# Patient Record
Sex: Male | Born: 1990 | Race: White | Hispanic: No | Marital: Single | Smoking: Never smoker
Health system: Southern US, Community
[De-identification: ages and names within clinical notes are randomized; demographics above are authoritative.]

## PROBLEM LIST (undated history)

## (undated) DIAGNOSIS — F329 Major depressive disorder, single episode, unspecified: Secondary | ICD-10-CM

## (undated) DIAGNOSIS — F419 Anxiety disorder, unspecified: Secondary | ICD-10-CM

## (undated) DIAGNOSIS — F32A Depression, unspecified: Secondary | ICD-10-CM

## (undated) HISTORY — DX: Depression, unspecified: F32.A

## (undated) HISTORY — DX: Major depressive disorder, single episode, unspecified: F32.9

## (undated) HISTORY — DX: Anxiety disorder, unspecified: F41.9

---

## 2013-09-25 ENCOUNTER — Emergency Department (INDEPENDENT_AMBULATORY_CARE_PROVIDER_SITE_OTHER): Payer: BC Managed Care – PPO

## 2013-09-25 ENCOUNTER — Encounter (HOSPITAL_COMMUNITY): Payer: Self-pay | Admitting: Emergency Medicine

## 2013-09-25 ENCOUNTER — Emergency Department (INDEPENDENT_AMBULATORY_CARE_PROVIDER_SITE_OTHER)
Admission: EM | Admit: 2013-09-25 | Discharge: 2013-09-25 | Disposition: A | Discharge status: Home / Self Care | Payer: BC Managed Care – PPO | Source: Home / Self Care | Attending: Family Medicine | Admitting: Family Medicine

## 2013-09-25 DIAGNOSIS — J40 Bronchitis, not specified as acute or chronic: Secondary | ICD-10-CM

## 2013-09-25 MED ORDER — IPRATROPIUM BROMIDE 0.02 % IN SOLN
0.5000 mg | Freq: Once | RESPIRATORY_TRACT | Status: AC
  Administered 2013-09-25: 0.5 mg via RESPIRATORY_TRACT

## 2013-09-25 MED ORDER — ALBUTEROL SULFATE (5 MG/ML) 0.5% IN NEBU
5.0000 mg | INHALATION_SOLUTION | Freq: Once | RESPIRATORY_TRACT | Status: AC
  Administered 2013-09-25: 5 mg via RESPIRATORY_TRACT

## 2013-09-25 MED ORDER — ALBUTEROL SULFATE HFA 108 (90 BASE) MCG/ACT IN AERS
INHALATION_SPRAY | RESPIRATORY_TRACT | Status: AC
  Filled 2013-09-25: qty 6.7

## 2013-09-25 MED ORDER — IPRATROPIUM BROMIDE 0.02 % IN SOLN
RESPIRATORY_TRACT | Status: AC
  Filled 2013-09-25: qty 2.5

## 2013-09-25 MED ORDER — SODIUM CHLORIDE 0.9 % IN NEBU
INHALATION_SOLUTION | RESPIRATORY_TRACT | Status: AC
  Filled 2013-09-25: qty 6

## 2013-09-25 MED ORDER — PREDNISONE 50 MG PO TABS: 50.0000 mg | ORAL_TABLET | Freq: Every day | ORAL | Status: DC

## 2013-09-25 MED ORDER — ALBUTEROL SULFATE HFA 108 (90 BASE) MCG/ACT IN AERS: 1.0000 | INHALATION_SPRAY | Freq: Four times a day (QID) | RESPIRATORY_TRACT | Status: DC | PRN

## 2013-09-25 MED ORDER — ALBUTEROL SULFATE (5 MG/ML) 0.5% IN NEBU
INHALATION_SOLUTION | RESPIRATORY_TRACT | Status: AC
  Filled 2013-09-25: qty 1

## 2013-09-25 MED ORDER — ALBUTEROL SULFATE HFA 108 (90 BASE) MCG/ACT IN AERS
1.0000 | INHALATION_SPRAY | Freq: Four times a day (QID) | RESPIRATORY_TRACT | Status: DC | PRN
  Administered 2013-09-25: 2 via RESPIRATORY_TRACT

## 2013-09-25 NOTE — ED Notes (Signed)
Pt c/o constant chest pain onset yest around 1500 Sxs include: pain that increases w/deep breaths, palpitations, left arm numbness yest (not today), blurry vision, HA, SOB, nauseas Denies: diaphoretic.  Alert w/no signs of acute distress.

## 2013-09-25 NOTE — ED Provider Notes (Signed)
Phillip Valentine is a 22 y.o. male who presents to Urgent Care today for patient notes left-sided nonradiating nonexertional chest pain. This started yesterday. He describes a stabbing sensation. He denies any shortness of breath nausea vomiting or diarrhea or palpitations. He does not that he seemed to have some worsening shortness of breath with exertion over the last several weeks. He denies any leg swelling or recent immobility. He feels well otherwise.   History reviewed. No pertinent past medical history. History  Substance Use Topics  . Smoking status: Never Smoker   . Smokeless tobacco: Not on file  . Alcohol Use: Yes   ROS as above Medications reviewed. Current Facility-Administered Medications  Medication Dose Route Frequency Provider Last Rate Last Dose  . albuterol (PROVENTIL HFA;VENTOLIN HFA) 108 (90 BASE) MCG/ACT inhaler 1-2 puff  1-2 puff Inhalation Q6H PRN Rodolph Bong, MD       Current Outpatient Prescriptions  Medication Sig Dispense Refill  . albuterol (PROVENTIL HFA;VENTOLIN HFA) 108 (90 BASE) MCG/ACT inhaler Inhale 1-2 puffs into the lungs every 6 (six) hours as needed for wheezing or shortness of breath.  1 Inhaler  1  . predniSONE (DELTASONE) 50 MG tablet Take 1 tablet (50 mg total) by mouth daily.  5 tablet  0    Exam:  BP 136/60  Pulse 68  Temp(Src) 98.2 F (36.8 C) (Oral)  Resp 18  SpO2 100% Gen: Well NAD HEENT: EOMI,  MMM Lungs: Normal work of breathing. Rhonchi left lower lobe Heart: RRR no MRG Abd: NABS, NT, ND Exts: Non edematous BL  LE, warm and well perfused.   Twelve-lead EKG shows normal sinus rhythm at 69 beats per minute. Mild sinus arrhythmia. Otherwise unremarkable  Patient was given DuoNeb nebulizer treatment and had considerable improvement in symptoms and normalization of his pulmonary exam.  No results found for this or any previous visit (from the past 24 hour(s)). Dg Chest 2 View  09/25/2013   CLINICAL DATA:  Left-sided chest  pain  EXAM: CHEST  2 VIEW  COMPARISON:  None.  FINDINGS: The heart size and mediastinal contours are within normal limits. Both lungs are clear. The visualized skeletal structures are unremarkable.  IMPRESSION: No active cardiopulmonary disease.   Electronically Signed   By: Alcide Clever M.D.   On: 09/25/2013 13:50    Assessment and Plan: 22 y.o. male with bronchitis versus reactive airway disease.  Plan to treat with prednisone, and albuterol.  [facility] or back here as needed.  Discussed warning signs or symptoms. Please see discharge instructions. Patient expresses understanding.      Rodolph Bong, MD 09/25/13 [phone]

## 2014-07-17 ENCOUNTER — Ambulatory Visit (INDEPENDENT_AMBULATORY_CARE_PROVIDER_SITE_OTHER): Payer: Self-pay

## 2014-07-17 ENCOUNTER — Ambulatory Visit (INDEPENDENT_AMBULATORY_CARE_PROVIDER_SITE_OTHER): Payer: Self-pay | Admitting: Internal Medicine

## 2014-07-17 VITALS — BP 130/72 | HR 80 | Temp 98.6°F | Resp 12 | Ht 72.0 in | Wt 175.4 lb

## 2014-07-17 DIAGNOSIS — S61209A Unspecified open wound of unspecified finger without damage to nail, initial encounter: Secondary | ICD-10-CM

## 2014-07-17 DIAGNOSIS — S060X9S Concussion with loss of consciousness of unspecified duration, sequela: Secondary | ICD-10-CM

## 2014-07-17 DIAGNOSIS — S0120XA Unspecified open wound of nose, initial encounter: Secondary | ICD-10-CM

## 2014-07-17 DIAGNOSIS — IMO0002 Reserved for concepts with insufficient information to code with codable children: Secondary | ICD-10-CM

## 2014-07-17 DIAGNOSIS — S069X9S Unspecified intracranial injury with loss of consciousness of unspecified duration, sequela: Secondary | ICD-10-CM

## 2014-07-17 DIAGNOSIS — S0083XS Contusion of other part of head, sequela: Secondary | ICD-10-CM

## 2014-07-17 DIAGNOSIS — S069XAS Unspecified intracranial injury with loss of consciousness status unknown, sequela: Secondary | ICD-10-CM

## 2014-07-17 DIAGNOSIS — S022XXA Fracture of nasal bones, initial encounter for closed fracture: Secondary | ICD-10-CM

## 2014-07-17 MED ORDER — DOXYCYCLINE HYCLATE 100 MG PO TABS: 100.0000 mg | ORAL_TABLET | Freq: Two times a day (BID) | ORAL | Status: DC

## 2014-07-17 MED ORDER — MUPIROCIN 2 % EX OINT: 1.0000 "application " | TOPICAL_OINTMENT | Freq: Three times a day (TID) | CUTANEOUS | Status: DC

## 2014-07-17 NOTE — Patient Instructions (Addendum)
Wash with soap and water.    WOUND CARE Please return in Thursday 9/3 days to have your stitches/staples removed or sooner if you have concerns. Marland Kitchen Keep area clean and dry for 24 hours. Do not remove bandage, if applied. . After 24 hours, remove bandage and wash wound gently with mild soap and warm water. Reapply a new bandage after cleaning wound, if directed. . Continue daily cleansing with soap and water until stitches/staples are removed. . Do not apply any ointments or creams to the wound while stitches/staples are in place, as this may cause delayed healing. . Notify the office if you experience any of the following signs of infection: Swelling, redness, pus drainage, streaking, fever >101.0 F . Notify the office if you experience excessive bleeding that does not stop after 15-20 minutes of constant, firm pressure.     How Much is Too Much Alcohol? Drinking too much alcohol can cause injury, accidents, and health problems. These types of problems can include:   Car crashes.  Falls.  Family fighting (domestic violence).  Drowning.  Fights.  Injuries.  Burns.  Damage to certain organs.  Having a baby with birth defects. ONE DRINK CAN BE TOO MUCH WHEN YOU ARE:  Working.  Pregnant or breastfeeding.  Taking medicines. Ask your doctor.  Driving or planning to drive. WHAT IS A STANDARD DRINK?   1 regular beer (12 ounces or 360 milliliters).  1 glass of wine (5 ounces or 150 milliliters).  1 shot of liquor (1.5 ounces or 45 milliliters). BLOOD ALCOHOL LEVELS   .00 A person is sober.  Marland Kitchen03 A person has no trouble keeping balance, talking, or seeing right, but a "buzz" may be felt.  Marland Kitchen05 A person feels "buzzed" and relaxed.  Marland Kitchen08 or .10  A person is drunk. He or she has trouble talking, seeing right, and keeping his or her balance.  .15 A person loses body control and may pass out (blackout).  .20 A person has trouble walking (staggering) and throws up  (vomits).  .30 A person will pass out (unconscious).  .40+ A person will be in a coma. Death is possible. If you or someone you know has a drinking problem, get help from a doctor.  Document Released: 09/01/2009 Document Revised: 01/28/2012 Document Reviewed: 09/01/2009 [facility] Patient Information 2015 [place], [place]. This information is not intended to replace advice given to you by your health care provider. Make sure you discuss any questions you have with your health care provider. Alcohol Intoxication Alcohol intoxication occurs when the amount of alcohol that a person has consumed impairs his or her ability to mentally and physically function. Alcohol directly impairs the normal chemical activity of the brain. Drinking large amounts of alcohol can lead to changes in mental function and behavior, and it can cause many physical effects that can be harmful.  Alcohol intoxication can range in severity from mild to very severe. Various factors can affect the level of intoxication that occurs, such as the person's age, gender, weight, frequency of alcohol consumption, and the presence of other medical conditions (such as diabetes, seizures, or heart conditions). Dangerous levels of alcohol intoxication may occur when people drink large amounts of alcohol in a short period (binge drinking). Alcohol can also be especially dangerous when combined with certain prescription medicines or "recreational" drugs. SIGNS AND SYMPTOMS Some common signs and symptoms of mild alcohol intoxication include:  Loss of coordination.  Changes in mood and behavior.  Impaired judgment.  Slurred speech. As  alcohol intoxication progresses to more severe levels, other signs and symptoms will appear. These may include:  Vomiting.  Confusion and impaired memory.  Slowed breathing.  Seizures.  Loss of consciousness. DIAGNOSIS  Your health care provider will take a medical history and perform a physical exam.  You will be asked about the amount and type of alcohol you have consumed. Blood tests will be done to measure the concentration of alcohol in your blood. In many places, your blood alcohol level must be lower than 80 mg/dL (1.61%) to legally drive. However, many dangerous effects of alcohol can occur at much lower levels.  TREATMENT  People with alcohol intoxication often do not require treatment. Most of the effects of alcohol intoxication are temporary, and they go away as the alcohol naturally leaves the body. Your health care provider will monitor your condition until you are stable enough to go home. Fluids are sometimes given through an IV access tube to help prevent dehydration.  HOME CARE INSTRUCTIONS  Do not drive after drinking alcohol.  Stay hydrated. Drink enough water and fluids to keep your urine clear or pale yellow. Avoid caffeine.   Only take over-the-counter or prescription medicines as directed by your health care provider.  SEEK MEDICAL CARE IF:   You have persistent vomiting.   You do not feel better after a few days.  You have frequent alcohol intoxication. Your health care provider can help determine if you should see a substance use treatment counselor. SEEK IMMEDIATE MEDICAL CARE IF:   You become shaky or tremble when you try to stop drinking.   You shake uncontrollably (seizure).   You throw up (vomit) blood. This may be bright red or may look like black coffee grounds.   You have blood in your stool. This may be bright red or may appear as a black, tarry, bad smelling stool.   You become lightheaded or faint.  MAKE SURE YOU:   Understand these instructions.  Will watch your condition.  Will get help right away if you are not doing well or get worse. Document Released: 08/15/2005 Document Revised: 07/08/2013 Document Reviewed: 04/10/2013 [facility] Patient Information 2015 [place], [place]. This information is not intended to replace advice given to  you by your health care provider. Make sure you discuss any questions you have with your health care provider. Head Injury You have received a head injury. It does not appear serious at this time. Headaches and vomiting are common following head injury. It should be easy to awaken from sleeping. Sometimes it is necessary for you to stay in the emergency department for a while for observation. Sometimes admission to the hospital may be needed. After injuries such as yours, most problems occur within the first 24 hours, but side effects may occur up to 7-10 days after the injury. It is important for you to carefully monitor your condition and contact your health care provider or seek immediate medical care if there is a change in your condition. WHAT ARE THE TYPES OF HEAD INJURIES? Head injuries can be as minor as a bump. Some head injuries can be more severe. More severe head injuries include:  A jarring injury to the brain (concussion).  A bruise of the brain (contusion). This mean there is bleeding in the brain that can cause swelling.  A cracked skull (skull fracture).  Bleeding in the brain that collects, clots, and forms a bump (hematoma). WHAT CAUSES A HEAD INJURY? A serious head injury is most likely to  happen to someone who is in a car wreck and is not wearing a seat belt. Other causes of major head injuries include bicycle or motorcycle accidents, sports injuries, and falls. HOW ARE HEAD INJURIES DIAGNOSED? A complete history of the event leading to the injury and your current symptoms will be helpful in diagnosing head injuries. Many times, pictures of the brain, such as CT or MRI are needed to see the extent of the injury. Often, an overnight hospital stay is necessary for observation.  WHEN SHOULD I SEEK IMMEDIATE MEDICAL CARE?  You should get help right away if:  You have confusion or drowsiness.  You feel sick to your stomach (nauseous) or have continued, forceful vomiting.  You  have dizziness or unsteadiness that is getting worse.  You have severe, continued headaches not relieved by medicine. Only take over-the-counter or prescription medicines for pain, fever, or discomfort as directed by your health care provider.  You do not have normal function of the arms or legs or are unable to walk.  You notice changes in the black spots in the center of the colored part of your eye (pupil).  You have a clear or bloody fluid coming from your nose or ears.  You have a loss of vision. During the next 24 hours after the injury, you must stay with someone who can watch you for the warning signs. This person should contact local emergency services (911 in the [place] if you have seizures, you become unconscious, or you are unable to wake up. HOW CAN I PREVENT A HEAD INJURY IN THE FUTURE? The most important factor for preventing major head injuries is avoiding motor vehicle accidents. To minimize the potential for damage to your head, it is crucial to wear seat belts while riding in motor vehicles. Wearing helmets while bike riding and playing collision sports (like football) is also helpful. Also, avoiding dangerous activities around the house will further help reduce your risk of head injury.  WHEN CAN I RETURN TO NORMAL ACTIVITIES AND ATHLETICS? You should be reevaluated by your health care provider before returning to these activities. If you have any of the following symptoms, you should not return to activities or contact sports until 1 week after the symptoms have stopped:  Persistent headache.  Dizziness or vertigo.  Poor attention and concentration.  Confusion.  Memory problems.  Nausea or vomiting.  Fatigue or tire easily.  Irritability.  Intolerant of bright lights or loud noises.  Anxiety or depression.  Disturbed sleep. MAKE SURE YOU:   Understand these instructions.  Will watch your condition.  Will get help right away if you are not doing well or  get worse. Document Released: 11/05/2005 Document Revised: 11/10/2013 Document Reviewed: 07/13/2013 [facility] Patient Information 2015 [place], [place]. This information is not intended to replace advice given to you by your health care provider. Make sure you discuss any questions you have with your health care provider.
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
## 2014-07-18 ENCOUNTER — Ambulatory Visit (INDEPENDENT_AMBULATORY_CARE_PROVIDER_SITE_OTHER): Payer: Self-pay | Admitting: Internal Medicine

## 2014-07-18 VITALS — BP 118/72 | HR 65 | Temp 98.6°F | Resp 15 | Ht 71.5 in | Wt 176.8 lb

## 2014-07-18 DIAGNOSIS — S61209A Unspecified open wound of unspecified finger without damage to nail, initial encounter: Secondary | ICD-10-CM

## 2014-07-18 DIAGNOSIS — S0180XA Unspecified open wound of other part of head, initial encounter: Secondary | ICD-10-CM

## 2014-07-18 DIAGNOSIS — S61209D Unspecified open wound of unspecified finger without damage to nail, subsequent encounter: Secondary | ICD-10-CM

## 2014-07-18 DIAGNOSIS — S0180XD Unspecified open wound of other part of head, subsequent encounter: Secondary | ICD-10-CM

## 2014-07-18 DIAGNOSIS — S022XXD Fracture of nasal bones, subsequent encounter for fracture with routine healing: Secondary | ICD-10-CM

## 2014-07-18 DIAGNOSIS — IMO0001 Reserved for inherently not codable concepts without codable children: Secondary | ICD-10-CM

## 2014-07-18 DIAGNOSIS — Z5189 Encounter for other specified aftercare: Secondary | ICD-10-CM

## 2014-07-18 NOTE — Progress Notes (Signed)
   Subjective:    Patient ID: Phillip Valentine, male    DOB: [birth year], 23 y.o.   MRN: [id number]  HPI Fx nose minimal displaced. Wound nose healing Avulsed finger nail replaced and healing. Multiple abrasions all over using mupirocin No signs of concussion and no HA. All joints full rom   Review of Systems     Objective:   Physical Exam  Constitutional: He is oriented to person, place, and time. He appears well-developed and well-nourished. No distress.  HENT:  Head: Normocephalic.  Right Ear: External ear normal.  Left Ear: External ear normal.  Eyes: EOM are normal. Pupils are equal, round, and reactive to light. No scleral icterus.  Neck: Normal range of motion. Neck supple.  Pulmonary/Chest: Effort normal.  Musculoskeletal: He exhibits edema and tenderness.  Lymphadenopathy:    He has no cervical adenopathy.  Neurological: He is alert and oriented to person, place, and time. No cranial nerve deficit. He exhibits normal muscle tone. Coordination normal.  Skin: Abrasion, bruising, ecchymosis and laceration noted. Rash is not pustular.     Psychiatric: He has a normal mood and affect. His behavior is normal. Judgment and thought content normal.          Assessment & Plan:  CD of fx ENT phone given Remove sutures 4 days Wound and head care

## 2014-07-18 NOTE — Patient Instructions (Signed)
Nasal Fracture A nasal fracture is a break or crack in the bones of the nose. A minor break usually heals in a month. You often will receive black eyes from a nasal fracture. This is not a cause for concern. The black eyes will go away over 1 to 2 weeks.  DIAGNOSIS  Your caregiver may want to examine you if you are concerned about a fracture of the nose. X-rays of the nose may not show a nasal fracture even when one is present. Sometimes your caregiver must wait 1 to 5 days after the injury to re-check the nose for alignment and to take additional X-rays. Sometimes the caregiver must wait until the swelling has gone down. TREATMENT Minor fractures that have caused no deformity often do not require treatment. More serious fractures where bones are displaced may require surgery. This will take place after the swelling is gone. Surgery will stabilize and align the fracture. HOME CARE INSTRUCTIONS   Put ice on the injured area.  Put ice in a plastic bag.  Place a towel between your skin and the bag.  Leave the ice on for 15-20 minutes, 03-04 times a day.  Take medications as directed by your caregiver.  Only take over-the-counter or prescription medicines for pain, discomfort, or fever as directed by your caregiver.  If your nose starts bleeding, squeeze the soft parts of the nose against the center wall while you are sitting in an upright position for 10 minutes.  Contact sports should be avoided for at least 3 to 4 weeks or as directed by your caregiver. SEEK MEDICAL CARE IF:  Your pain increases or becomes severe.  You continue to have nosebleeds.  The shape of your nose does not return to normal within 5 days.  You have pus draining from the nose. SEEK IMMEDIATE MEDICAL CARE IF:   You have bleeding from your nose that does not stop after 20 minutes of pinching the nostrils closed and keeping ice on the nose.  You have clear fluid draining from your nose.  You notice a grape-like  swelling on the dividing wall between the nostrils (septum). This is a collection of blood (hematoma) that must be drained to help prevent infection.  You have difficulty moving your eyes.  You have recurrent vomiting. Document Released: 11/02/2000 Document Revised: 01/28/2012 Document Reviewed: 02/19/2011 [employer] Patient Information 2015 [employer]. This information is not intended to replace advice given to you by your health care provider. Make sure you discuss any questions you have with your health care provider.  

## 2014-07-22 ENCOUNTER — Ambulatory Visit (INDEPENDENT_AMBULATORY_CARE_PROVIDER_SITE_OTHER): Payer: Self-pay | Admitting: Family Medicine

## 2014-07-22 VITALS — BP 130/70 | HR 61 | Temp 97.7°F | Resp 16 | Ht 71.75 in | Wt 176.0 lb

## 2014-07-22 DIAGNOSIS — Z5189 Encounter for other specified aftercare: Secondary | ICD-10-CM
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
## 2015-05-05 ENCOUNTER — Ambulatory Visit (INDEPENDENT_AMBULATORY_CARE_PROVIDER_SITE_OTHER): Payer: Self-pay | Admitting: Emergency Medicine

## 2015-05-05 VITALS — BP 142/82 | HR 75 | Temp 98.6°F | Resp 16 | Ht 71.5 in | Wt 188.0 lb

## 2015-05-05 DIAGNOSIS — F32A Depression, unspecified: Secondary | ICD-10-CM | POA: Insufficient documentation

## 2015-05-05 DIAGNOSIS — F329 Major depressive disorder, single episode, unspecified: Secondary | ICD-10-CM | POA: Diagnosis not present

## 2015-05-05 MED ORDER — SERTRALINE HCL 50 MG PO TABS: 50.0000 mg | ORAL_TABLET | Freq: Every day | ORAL | Status: AC

## 2015-05-05 MED ORDER — LORAZEPAM 1 MG PO TABS: 1.0000 mg | ORAL_TABLET | Freq: Three times a day (TID) | ORAL | Status: AC | PRN

## 2015-05-05 NOTE — Patient Instructions (Signed)

## 2015-05-05 NOTE — Progress Notes (Signed)
Subjective:  Patient ID: Phillip Valentine, male    DOB: 17-May-1991  Age: 24 y.o. MRN: [id number]  CC: Exposure to STD; Medication Refill; Depression; and Anxiety   HPI Phillip Valentine presents  with a history of anxiety and depression. Been treated by psychologist who recommended he come and obtain a prescription for some psychoactive medication. He is on [employer] and is imminently going to transfer from [facility] He has some anxiety about that. 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  He's been so pressured that  he threw a glass and against a wall. He saw the glass fragments and picked a piece up and scratched his arm several times to relieve the pressure. He denies any suicidal thoughts or thoughts of harm to other people attempted suicidal act. He is eating well but not sleeping well.  He said that his current sexual partner told him that she has HPV and he wants an STD check he said that he's been sexually active for a long, not ever be tested. He's asymptomatic.Marland Kitchen History Phillip Valentine has a past medical history of Anxiety and Depression.   He has no past surgical history on file.   His  family history includes Alcohol abuse in his father; Depression in his mother; Diabetes in his maternal uncle; Heart disease in his maternal grandfather; Hyperlipidemia in his maternal grandmother.  He   reports that he has never smoked. He has never used smokeless tobacco. He reports that he drinks alcohol. He reports that he does not use illicit drugs.  Outpatient Prescriptions Prior to Visit  Medication Sig Dispense Refill  . doxycycline (VIBRA-TABS) 100 MG tablet Take 1 tablet (100 mg total) by mouth 2 (two) times daily. 20 tablet 0  . mupirocin ointment (BACTROBAN) 2 % Apply 1 application topically 3 (three) times daily. 60 g 2   No facility-administered medications prior to visit.    History   Social History  . Marital Status:  Single    Spouse Name: N/A  . Number of Children: N/A  . Years of Education: N/A   Social History Main Topics  . Smoking status: Never Smoker   . Smokeless tobacco: Never Used  . Alcohol Use: Yes  . Drug Use: No  . Sexual Activity: Not on file   Other Topics Concern  . None   Social History Narrative     Review of Systems  Objective:  BP 142/82 mmHg  Pulse 75  Temp(Src) 98.6 F (37 C) (Oral)  Resp 16  Ht 5' 11.5" (1.816 m)  Wt 188 lb (85.276 kg)  BMI 25.86 kg/m2  SpO2 99%  Physical Exam    Assessment & Plan:   Phillip Valentine was seen today for exposure to std, medication refill, depression and anxiety.  Diagnoses and all orders for this visit:  Depression Orders: -     GC/Chlamydia Probe Amp -     RPR -     HIV antibody -     HSV(herpes simplex vrs) 1+2 ab-IgG  Other orders -     sertraline (ZOLOFT) 50 MG tablet; Take 1 tablet (50 mg total) by mouth daily. -     LORazepam (ATIVAN) 1 MG tablet; Take 1 tablet (1 mg total) by mouth every 8 (eight) hours as needed for anxiety.   I have discontinued Mr. Benak mupirocin ointment and doxycycline. I am also having him start on sertraline and LORazepam.  Meds ordered this encounter  Medications  .  sertraline (ZOLOFT) 50 MG tablet    Sig: Take 1 tablet (50 mg total) by mouth daily.    Dispense:  30 tablet    Refill:  3  . LORazepam (ATIVAN) 1 MG tablet    Sig: Take 1 tablet (1 mg total) by mouth every 8 (eight) hours as needed for anxiety.    Dispense:  60 tablet    Refill:  0   With a lengthy discussion about suicide and suicide risk I don't believe that he is a suicide risk is put on Paxil and Ativan for the short-term and a month or so, stylet medicines working for him  Appropriate red flag conditions were discussed with the patient as well as actions that should be taken.  Patient expressed his understanding.  Follow-up: Return if symptoms worsen or fail to improve.  Carmelina Dane, MD

## 2015-05-06 LAB — RPR

## 2015-05-06 LAB — GC/CHLAMYDIA PROBE AMP
CT PROBE, AMP APTIMA: NEGATIVE
GC Probe RNA: NEGATIVE

## 2015-05-07 LAB — HIV ANTIBODY (ROUTINE TESTING W REFLEX): HIV 1&2 Ab, 4th Generation: NONREACTIVE

## 2015-05-09 LAB — HSV(HERPES SIMPLEX VRS) I + II AB-IGG: HSV 1 Glycoprotein G Ab, IgG: <0.1 IV

## 2016-03-08 ENCOUNTER — Ambulatory Visit: Payer: Self-pay | Admitting: Anesthesiology

## 2016-06-19 IMAGING — CR DG FINGER LITTLE 2+V*R*
1 series · 1 of 1 positions shown · non-contrast
Comparison: None.

CLINICAL DATA: Fifth finger pain and abrasion

EXAM:
RIGHT LITTLE FINGER 2+V

[AP]
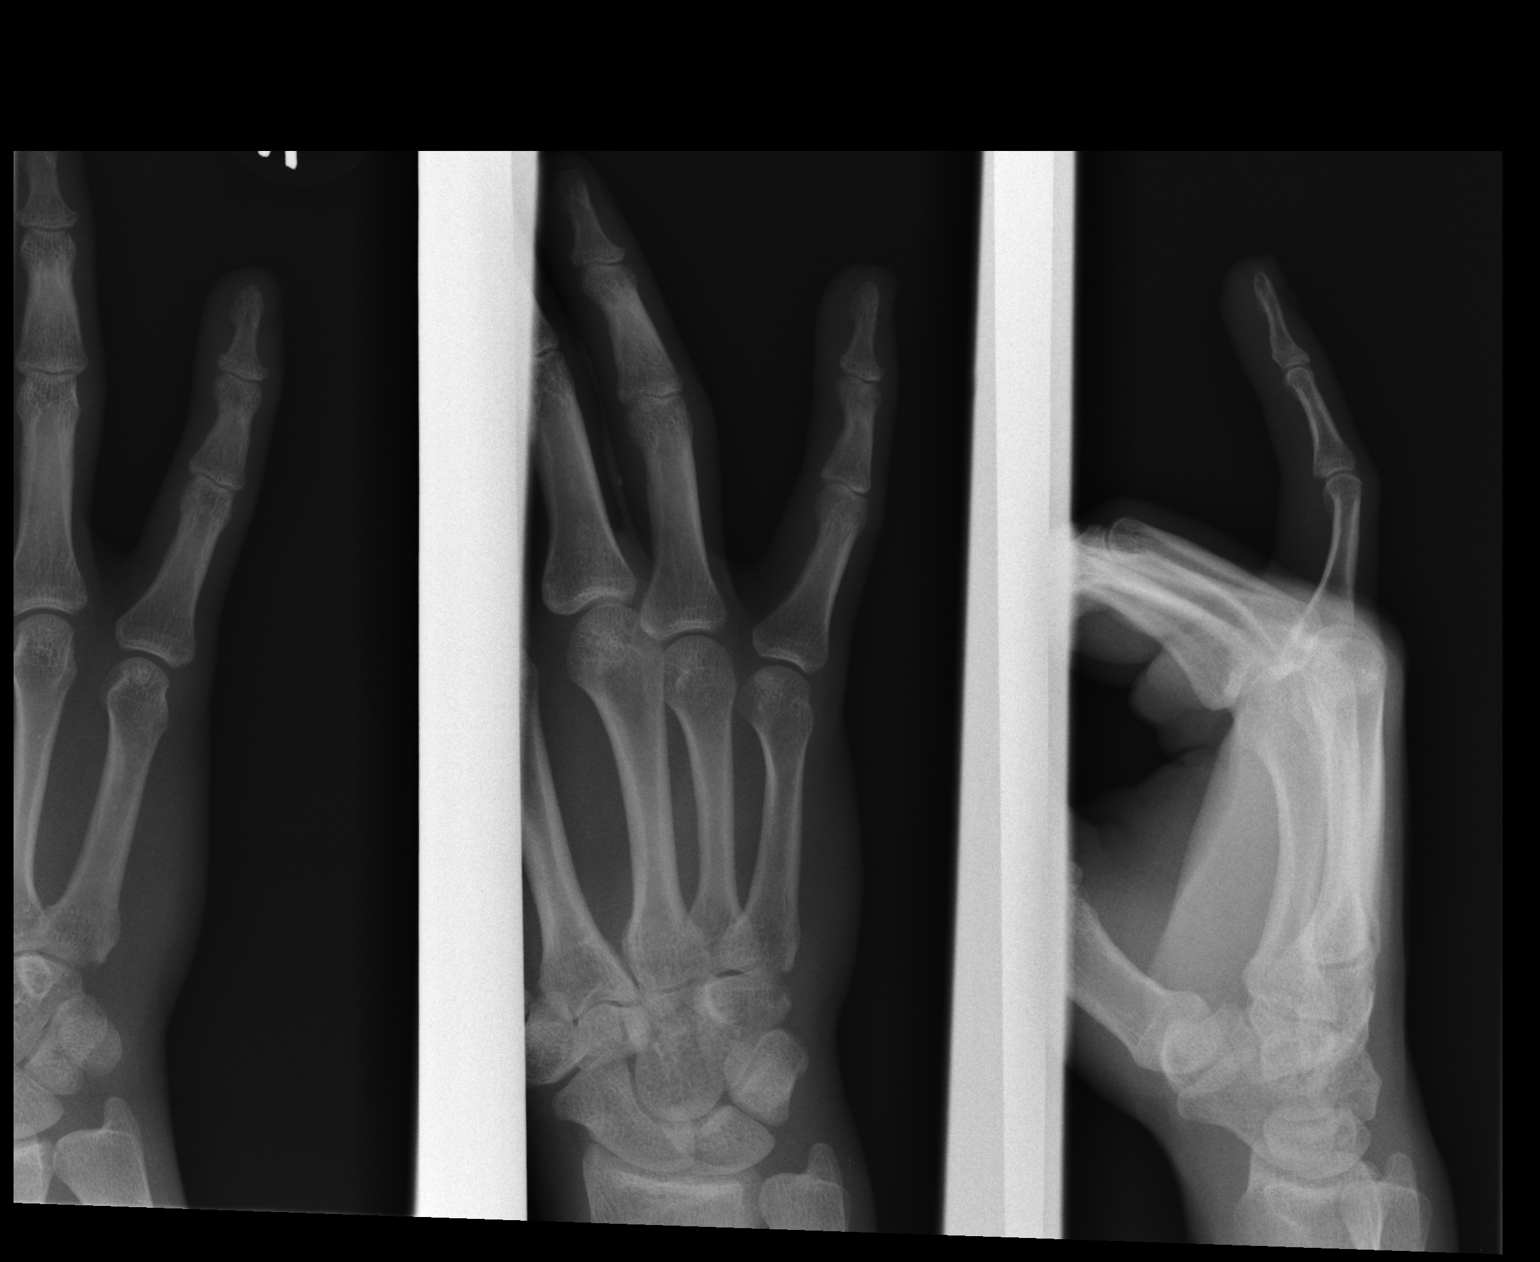

[1 of 1 positions shown; findings below may reference images not displayed]

FINDINGS: There is no evidence of fracture or dislocation. There is no
evidence of arthropathy or other focal bone abnormality. Soft
tissues are unremarkable.
IMPRESSION: Negative.

## 2016-06-19 IMAGING — CR DG NASAL BONES 3+V
2 series · 2 of 2 positions shown · non-contrast
Comparison: None.

CLINICAL DATA: Intoxicated, evaluate for fractured nose

EXAM:
NASAL BONES - 3+ VIEW

[PA]
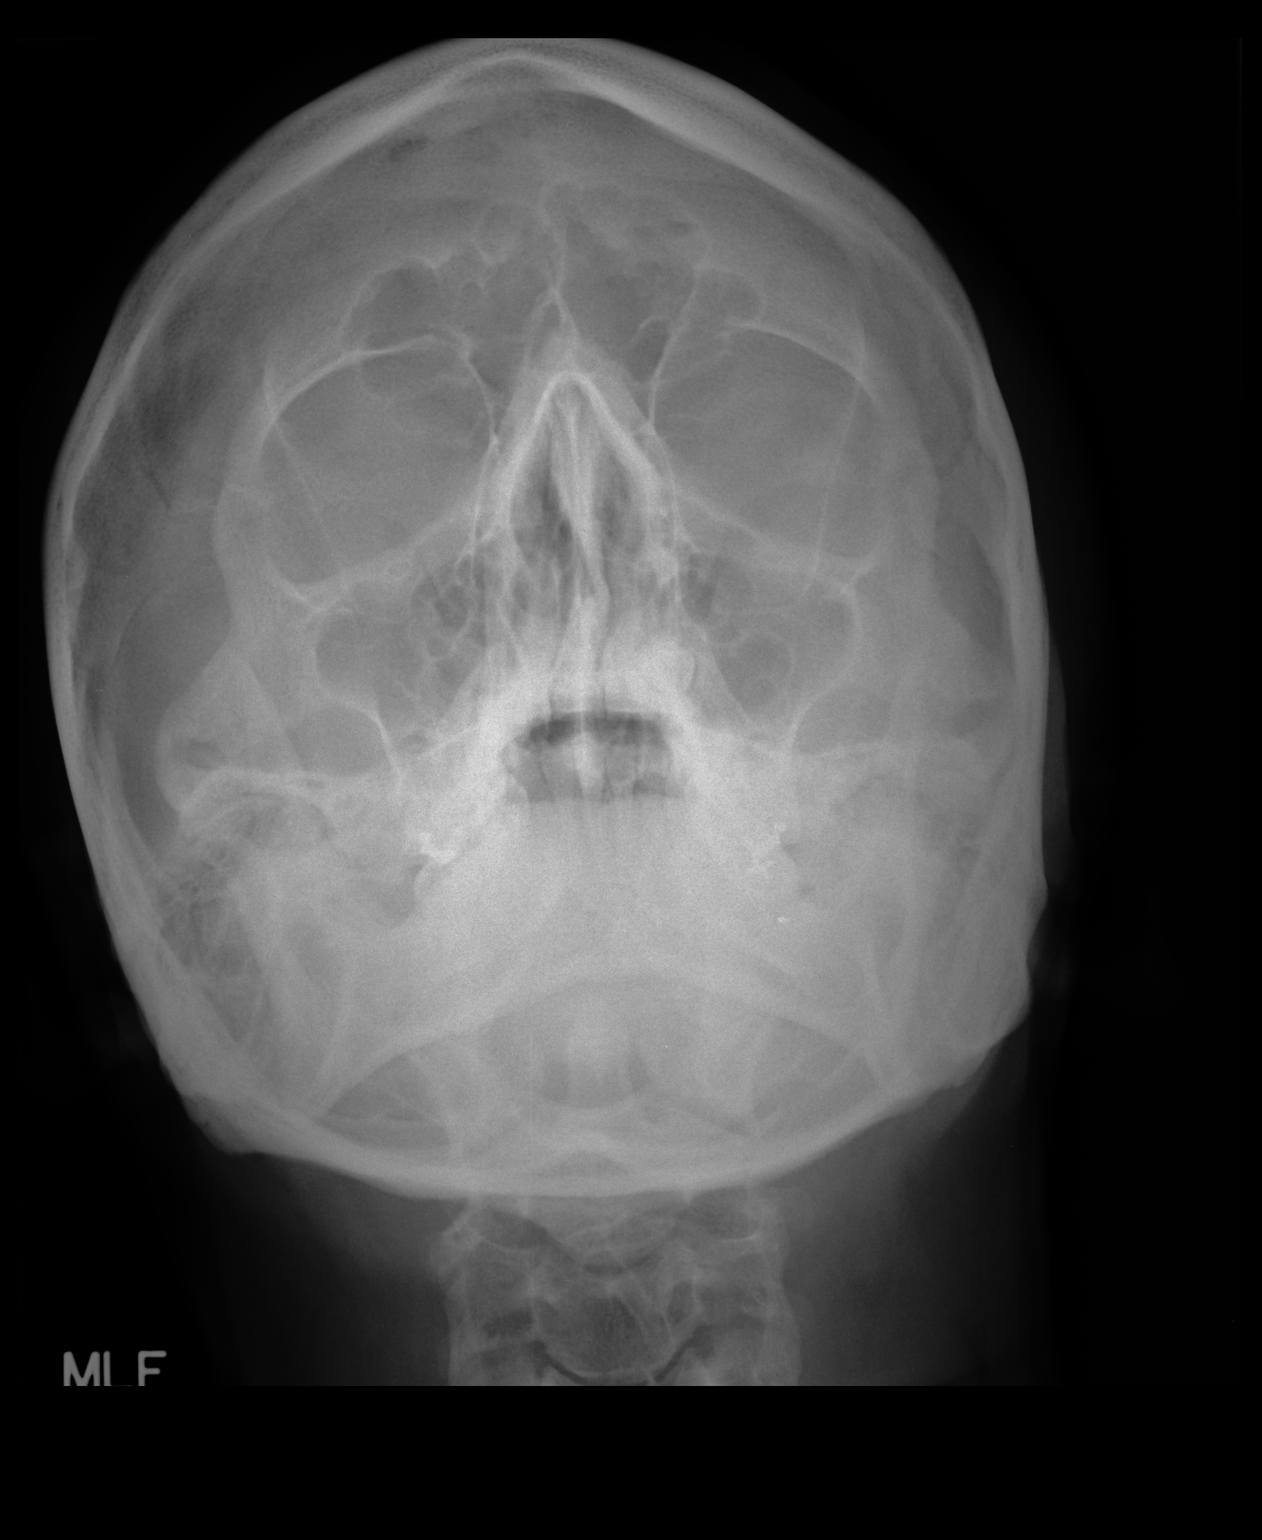

[lateral]
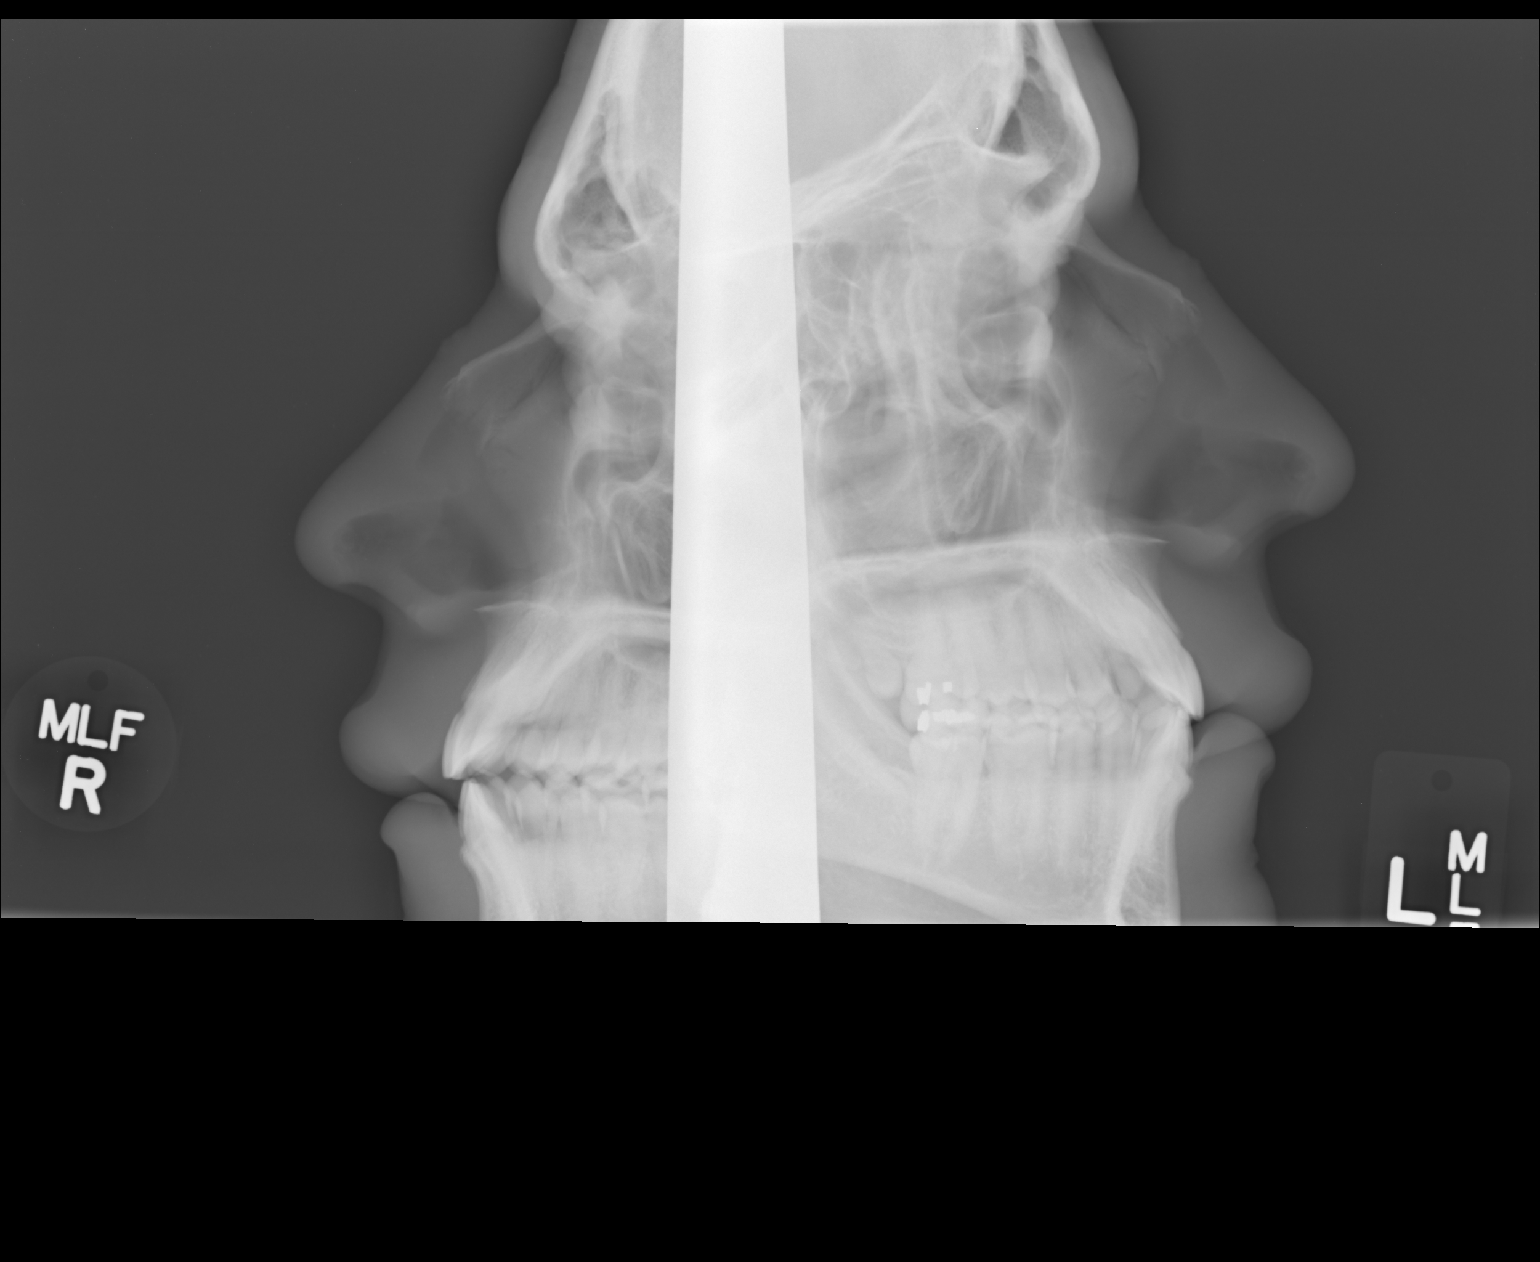

[2 of 2 positions shown; findings below may reference images not displayed]

FINDINGS: Focal soft tissue swelling and laceration overlying the bridge of
the nose. Minimally displaced fractures of the bilateral nasal
bones. The remainder the visualized bones face are unremarkable.
IMPRESSION: Minimally displaced fractures of the distal aspect of the bilateral
nasal bones.

There is associated overlying soft tissue swelling and laceration.
